# Patient Record
Sex: Female | Born: 2000 | Race: Black or African American | Hispanic: No | Marital: Single | Smoking: Never smoker
Health system: Southern US, Community
[De-identification: ages and names within clinical notes are randomized; demographics above are authoritative.]

---
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
## 2020-03-01 DIAGNOSIS — Z419 Encounter for procedure for purposes other than remedying health state, unspecified: Secondary | ICD-10-CM | POA: Diagnosis not present

## 2020-04-01 DIAGNOSIS — Z419 Encounter for procedure for purposes other than remedying health state, unspecified: Secondary | ICD-10-CM | POA: Diagnosis not present

## 2020-04-30 ENCOUNTER — Ambulatory Visit
Admission: EM | Admit: 2020-04-30 | Discharge: 2020-04-30 | Disposition: A | Discharge status: Home / Self Care | Payer: Medicaid Other | Attending: Emergency Medicine | Admitting: Emergency Medicine

## 2020-04-30 ENCOUNTER — Other Ambulatory Visit: Payer: Self-pay

## 2020-04-30 DIAGNOSIS — J069 Acute upper respiratory infection, unspecified: Secondary | ICD-10-CM | POA: Diagnosis not present

## 2020-04-30 DIAGNOSIS — Z20822 Contact with and (suspected) exposure to covid-19: Secondary | ICD-10-CM | POA: Insufficient documentation

## 2020-04-30 LAB — SARS CORONAVIRUS 2 (TAT 6-24 HRS): SARS Coronavirus 2: POSITIVE — AB

## 2020-04-30 NOTE — ED Provider Notes (Signed)
[facility]    CSN: [id number] Arrival date & time: 04/30/20  0941      History   Chief Complaint Chief Complaint  Patient presents with  . Nasal Congestion    HPI Victoria Howell is a 19 y.o. female who presents with URI symptoms and feeling hot and cold x 2 weeks. Denies body aches. Only had a HA x 1 days. Has had loss of smell and taste the fist week of sickness, which now taste is back, but sence of smell is still gone. Has been a little nauseous. Is no cycle right now.    History reviewed. No pertinent past medical history.  Patient Active Problem List   Diagnosis Date Noted  . MDD (major depressive disorder), single episode, severe (HCC) 04/13/2019    History reviewed. No pertinent surgical history.  OB History   No obstetric history on file.      Home Medications    Prior to Admission medications   Not on File    Family History History reviewed. No pertinent family history.  Social History Social History   Tobacco Use  . Smoking status: Never Smoker  . Smokeless tobacco: Never Used  Vaping Use  . Vaping Use: Every day  Substance Use Topics  . Alcohol use: No  . Drug use: Not Currently    Types: Marijuana     Allergies   Peanut oil   Review of Systems Review of Systems  Constitutional: Positive for appetite change, chills, diaphoresis and fatigue. Negative for activity change and fever.  HENT: Positive for congestion and postnasal drip. Negative for ear discharge, ear pain, rhinorrhea, sore throat and trouble swallowing.   Respiratory: Positive for cough. Negative for wheezing.        SOB with climbing steps  Cardiovascular: Negative for chest pain.  Gastrointestinal: Positive for nausea. Negative for abdominal pain, diarrhea and vomiting.       Has mild cramps from being on her period today   Genitourinary: Negative for difficulty urinating and dysuria.  Musculoskeletal: Negative for gait problem and myalgias.  Skin: Negative  for rash.  Neurological: Negative for dizziness, weakness and headaches.  Hematological: Negative for adenopathy.   Physical Exam Triage Vital Signs ED Triage Vitals  Enc Vitals Group     BP 04/30/20 1007 122/74     Pulse Rate 04/30/20 1007 79     Resp 04/30/20 1007 16     Temp 04/30/20 1007 98.3 F (36.8 C)     Temp Source 04/30/20 1007 Oral     SpO2 04/30/20 1007 100 %     Weight 04/30/20 1005 175 lb (79.4 kg)     Height 04/30/20 1005 5\' 3"  (1.6 m)     Head Circumference --      Peak Flow --      Pain Score 04/30/20 1005 0     Pain Loc --      Pain Edu? --      Excl. in GC? --    No data found.  Updated Vital Signs BP 122/74 (BP Location: Right Arm)   Pulse 79   Temp 98.3 F (36.8 C) (Oral)   Resp 16   Ht 5\' 3"  (1.6 m)   Wt 175 lb (79.4 kg)   LMP 04/27/2020   SpO2 100%   BMI 31.00 kg/m   Visual Acuity Right Eye Distance:   Left Eye Distance:   Bilateral Distance:    Right Eye Near:   Left Eye Near:  Bilateral Near:       Physical Exam Vitals signs and nursing note reviewed.  Constitutional:      General: She is not in acute distress.    Appearance: Normal appearance. She is not ill-appearing, toxic-appearing or diaphoretic.  HENT:     Head: Normocephalic.     Right Ear: Tympanic membrane, ear canal and external ear normal.     Left Ear: Tympanic membrane, ear canal and external ear normal.     Nose: has mild mucosa swelling on the R    Mouth/Throat:     Mouth: Mucous membranes are moist.  Eyes:     General: No scleral icterus.       Right eye: No discharge.        Left eye: No discharge.     Conjunctiva/sclera: Conjunctivae normal.  Neck:     Musculoskeletal: Neck supple. No neck rigidity.  Cardiovascular:     Rate and Rhythm: Normal rate and regular rhythm.     Heart sounds: No murmur.  Pulmonary:     Effort: Pulmonary effort is normal.     Breath sounds: Normal breath sounds.  Abdominal:     General: Bowel sounds are normal. There is  no distension.     Palpations: Abdomen is soft. There is no mass.     Tenderness: There is no abdominal tenderness. There is no guarding or rebound.     Hernia: No hernia is present.  Musculoskeletal: Normal range of motion.  Lymphadenopathy:     Cervical: No cervical adenopathy.  Skin:    General: Skin is warm and dry.     Coloration: Skin is not jaundiced.     Findings: No rash.  Neurological:     Mental Status: She is alert and oriented to person, place, and time.     Gait: Gait normal.  Psychiatric:        Mood and Affect: Mood normal.        Behavior: Behavior normal.        Thought Content: Thought content normal.        Judgment: Judgment normal.   UC Treatments / Results  Labs (all labs ordered are listed, but only abnormal results are displayed) Labs Reviewed  SARS CORONAVIRUS 2 (TAT 6-24 HRS)    EKG   Radiology No results found.  Procedures Procedures (including critical care time)  Medications Ordered in UC Medications - No data to display  Initial Impression / Assessment and Plan / UC Course  I have reviewed the triage vital signs and the nursing notes. Seems like she had Covid and is recovering from this.  She will check her Mychart for results.   Final Clinical Impressions(s) / UC Diagnoses   Final diagnoses:  Viral URI with cough     Discharge Instructions     Stay quarantined until your test is back     ED Prescriptions    None     PDMP not reviewed this encounter.   Garey Ham, PA-C 04/30/20 1121

## 2020-04-30 NOTE — ED Triage Notes (Addendum)
Pt reports she is here to determine if she has allergies or COVID. Runny nose, mild cough, scratchy throat x 2 weeks. Has to go to school and needs a COVID test.

## 2020-04-30 NOTE — Discharge Instructions (Signed)
Stay quarantined until your test is back.  

## 2020-05-02 DIAGNOSIS — Z419 Encounter for procedure for purposes other than remedying health state, unspecified: Secondary | ICD-10-CM | POA: Diagnosis not present

## 2020-06-01 DIAGNOSIS — Z419 Encounter for procedure for purposes other than remedying health state, unspecified: Secondary | ICD-10-CM | POA: Diagnosis not present

## 2020-06-13 ENCOUNTER — Other Ambulatory Visit: Payer: Self-pay

## 2020-06-13 ENCOUNTER — Encounter: Payer: Self-pay | Admitting: Emergency Medicine

## 2020-06-13 ENCOUNTER — Emergency Department
Admission: EM | Admit: 2020-06-13 | Discharge: 2020-06-13 | Disposition: A | Discharge status: Home / Self Care | Payer: Medicaid Other | Attending: Emergency Medicine | Admitting: Emergency Medicine

## 2020-06-13 ENCOUNTER — Emergency Department: Payer: Medicaid Other

## 2020-06-13 DIAGNOSIS — R519 Headache, unspecified: Secondary | ICD-10-CM | POA: Diagnosis not present

## 2020-06-13 DIAGNOSIS — S060X0A Concussion without loss of consciousness, initial encounter: Secondary | ICD-10-CM | POA: Diagnosis not present

## 2020-06-13 DIAGNOSIS — S0990XA Unspecified injury of head, initial encounter: Secondary | ICD-10-CM | POA: Diagnosis present

## 2020-06-13 MED ORDER — ACETAMINOPHEN 325 MG PO TABS
650.0000 mg | ORAL_TABLET | Freq: Once | ORAL | Status: AC
  Administered 2020-06-13: 650 mg via ORAL
  Filled 2020-06-13: qty 2
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
## 2020-06-13 NOTE — Discharge Instructions (Signed)
Please treat with tylenol 650 mg every 4-6 hours as needed for symptoms. Follow up with primary care if you do not improve, or return to the emergency department for any worsening.
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
## 2020-07-02 DIAGNOSIS — Z419 Encounter for procedure for purposes other than remedying health state, unspecified: Secondary | ICD-10-CM | POA: Diagnosis not present

## 2020-08-01 DIAGNOSIS — Z419 Encounter for procedure for purposes other than remedying health state, unspecified: Secondary | ICD-10-CM | POA: Diagnosis not present

## 2020-09-01 DIAGNOSIS — Z419 Encounter for procedure for purposes other than remedying health state, unspecified: Secondary | ICD-10-CM | POA: Diagnosis not present

## 2020-10-02 DIAGNOSIS — Z419 Encounter for procedure for purposes other than remedying health state, unspecified: Secondary | ICD-10-CM | POA: Diagnosis not present

## 2020-10-30 DIAGNOSIS — Z419 Encounter for procedure for purposes other than remedying health state, unspecified: Secondary | ICD-10-CM | POA: Diagnosis not present

## 2020-11-30 DIAGNOSIS — Z419 Encounter for procedure for purposes other than remedying health state, unspecified: Secondary | ICD-10-CM | POA: Diagnosis not present
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
## 2020-12-30 DIAGNOSIS — Z419 Encounter for procedure for purposes other than remedying health state, unspecified: Secondary | ICD-10-CM | POA: Diagnosis not present

## 2021-01-30 DIAGNOSIS — Z419 Encounter for procedure for purposes other than remedying health state, unspecified: Secondary | ICD-10-CM | POA: Diagnosis not present

## 2021-03-01 DIAGNOSIS — Z419 Encounter for procedure for purposes other than remedying health state, unspecified: Secondary | ICD-10-CM | POA: Diagnosis not present

## 2021-04-01 DIAGNOSIS — Z419 Encounter for procedure for purposes other than remedying health state, unspecified: Secondary | ICD-10-CM | POA: Diagnosis not present

## 2021-05-02 DIAGNOSIS — Z419 Encounter for procedure for purposes other than remedying health state, unspecified: Secondary | ICD-10-CM | POA: Diagnosis not present

## 2021-06-01 DIAGNOSIS — Z419 Encounter for procedure for purposes other than remedying health state, unspecified: Secondary | ICD-10-CM | POA: Diagnosis not present

## 2021-07-02 DIAGNOSIS — Z419 Encounter for procedure for purposes other than remedying health state, unspecified: Secondary | ICD-10-CM | POA: Diagnosis not present

## 2021-08-01 DIAGNOSIS — Z419 Encounter for procedure for purposes other than remedying health state, unspecified: Secondary | ICD-10-CM | POA: Diagnosis not present

## 2021-09-01 DIAGNOSIS — Z419 Encounter for procedure for purposes other than remedying health state, unspecified: Secondary | ICD-10-CM | POA: Diagnosis not present

## 2021-10-02 DIAGNOSIS — Z419 Encounter for procedure for purposes other than remedying health state, unspecified: Secondary | ICD-10-CM | POA: Diagnosis not present

## 2021-10-30 DIAGNOSIS — Z419 Encounter for procedure for purposes other than remedying health state, unspecified: Secondary | ICD-10-CM | POA: Diagnosis not present

## 2021-11-30 DIAGNOSIS — Z419 Encounter for procedure for purposes other than remedying health state, unspecified: Secondary | ICD-10-CM | POA: Diagnosis not present

## 2021-12-30 DIAGNOSIS — Z419 Encounter for procedure for purposes other than remedying health state, unspecified: Secondary | ICD-10-CM | POA: Diagnosis not present

## 2022-01-30 DIAGNOSIS — Z419 Encounter for procedure for purposes other than remedying health state, unspecified: Secondary | ICD-10-CM | POA: Diagnosis not present

## 2022-03-01 DIAGNOSIS — Z419 Encounter for procedure for purposes other than remedying health state, unspecified: Secondary | ICD-10-CM | POA: Diagnosis not present

## 2022-04-01 DIAGNOSIS — Z419 Encounter for procedure for purposes other than remedying health state, unspecified: Secondary | ICD-10-CM | POA: Diagnosis not present

## 2022-05-02 DIAGNOSIS — Z419 Encounter for procedure for purposes other than remedying health state, unspecified: Secondary | ICD-10-CM | POA: Diagnosis not present

## 2022-06-01 DIAGNOSIS — Z419 Encounter for procedure for purposes other than remedying health state, unspecified: Secondary | ICD-10-CM | POA: Diagnosis not present

## 2022-07-02 DIAGNOSIS — Z419 Encounter for procedure for purposes other than remedying health state, unspecified: Secondary | ICD-10-CM | POA: Diagnosis not present

## 2022-07-09 ENCOUNTER — Ambulatory Visit: Payer: Medicaid Other | Admitting: Physician Assistant

## 2022-08-01 DIAGNOSIS — Z419 Encounter for procedure for purposes other than remedying health state, unspecified: Secondary | ICD-10-CM | POA: Diagnosis not present

## 2022-09-01 DIAGNOSIS — Z419 Encounter for procedure for purposes other than remedying health state, unspecified: Secondary | ICD-10-CM | POA: Diagnosis not present

## 2022-09-27 ENCOUNTER — Emergency Department
Admission: EM | Admit: 2022-09-27 | Discharge: 2022-09-27 | Disposition: A | Discharge status: Home / Self Care | Payer: Medicaid Other | Attending: Emergency Medicine | Admitting: Emergency Medicine

## 2022-09-27 ENCOUNTER — Other Ambulatory Visit: Payer: Self-pay

## 2022-09-27 ENCOUNTER — Emergency Department: Payer: Medicaid Other

## 2022-09-27 DIAGNOSIS — S0990XA Unspecified injury of head, initial encounter: Secondary | ICD-10-CM | POA: Diagnosis present

## 2022-09-27 DIAGNOSIS — S70211A Abrasion, right hip, initial encounter: Secondary | ICD-10-CM | POA: Diagnosis not present

## 2022-09-27 DIAGNOSIS — Y92410 Unspecified street and highway as the place of occurrence of the external cause: Secondary | ICD-10-CM | POA: Insufficient documentation

## 2022-09-27 DIAGNOSIS — T07XXXA Unspecified multiple injuries, initial encounter: Secondary | ICD-10-CM

## 2022-09-27 DIAGNOSIS — R079 Chest pain, unspecified: Secondary | ICD-10-CM | POA: Diagnosis not present

## 2022-09-27 DIAGNOSIS — S0001XA Abrasion of scalp, initial encounter: Secondary | ICD-10-CM | POA: Diagnosis not present

## 2022-09-27 DIAGNOSIS — S0003XA Contusion of scalp, initial encounter: Secondary | ICD-10-CM | POA: Diagnosis not present

## 2022-09-27 DIAGNOSIS — Z043 Encounter for examination and observation following other accident: Secondary | ICD-10-CM | POA: Diagnosis not present

## 2022-09-27 DIAGNOSIS — S199XXA Unspecified injury of neck, initial encounter: Secondary | ICD-10-CM | POA: Diagnosis not present

## 2022-09-27 DIAGNOSIS — S70212A Abrasion, left hip, initial encounter: Secondary | ICD-10-CM | POA: Diagnosis not present

## 2022-09-27 DIAGNOSIS — S80212A Abrasion, left knee, initial encounter: Secondary | ICD-10-CM | POA: Insufficient documentation

## 2022-09-27 LAB — URINE DRUG SCREEN, QUALITATIVE (ARMC ONLY)
Amphetamines, Ur Screen: NOT DETECTED
Barbiturates, Ur Screen: NOT DETECTED
Benzodiazepine, Ur Scrn: NOT DETECTED
Cannabinoid 50 Ng, Ur ~~LOC~~: POSITIVE — AB
Cocaine Metabolite,Ur ~~LOC~~: NOT DETECTED
MDMA (Ecstasy)Ur Screen: NOT DETECTED
Methadone Scn, Ur: NOT DETECTED
Opiate, Ur Screen: NOT DETECTED
Phencyclidine (PCP) Ur S: NOT DETECTED
Tricyclic, Ur Screen: NOT DETECTED

## 2022-09-27 LAB — POC URINE PREG, ED: Preg Test, Ur: NEGATIVE

## 2022-09-27 MED ORDER — KETOROLAC TROMETHAMINE 10 MG PO TABS
10.0000 mg | ORAL_TABLET | Freq: Once | ORAL | Status: AC
  Administered 2022-09-27: 10 mg via ORAL
  Filled 2022-09-27: qty 1
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
## 2022-09-27 NOTE — ED Notes (Addendum)
Pt is adamantly refusing IV and blood at this time. EDP Roderic Palau at bedside.
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
## 2022-09-27 NOTE — ED Notes (Signed)
Patient refused IV or straight stick for blood draw. Dr. Joni Fears notified
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
## 2022-09-27 NOTE — Discharge Instructions (Addendum)
Your xrays and CT scans were all okay today.  Take ibuprofen or naproxen as needed for muscle soreness over the next few days. Return to the ER for further assessment if you have any new or worsening symptoms.

## 2022-09-27 NOTE — ED Notes (Signed)
Patient smells of alcohol. Patient reports she drinks daily

## 2022-09-27 NOTE — ED Notes (Signed)
Pt also smells of ETOH and admits to have been drinking.

## 2022-09-27 NOTE — ED Notes (Signed)
Patient reports pain generalized throughout
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
## 2022-10-02 DIAGNOSIS — Z419 Encounter for procedure for purposes other than remedying health state, unspecified: Secondary | ICD-10-CM | POA: Diagnosis not present

## 2022-10-31 DIAGNOSIS — Z419 Encounter for procedure for purposes other than remedying health state, unspecified: Secondary | ICD-10-CM | POA: Diagnosis not present

## 2022-12-01 DIAGNOSIS — Z419 Encounter for procedure for purposes other than remedying health state, unspecified: Secondary | ICD-10-CM | POA: Diagnosis not present

## 2022-12-31 DIAGNOSIS — Z419 Encounter for procedure for purposes other than remedying health state, unspecified: Secondary | ICD-10-CM | POA: Diagnosis not present

## 2023-01-31 DIAGNOSIS — Z419 Encounter for procedure for purposes other than remedying health state, unspecified: Secondary | ICD-10-CM | POA: Diagnosis not present

## 2023-03-02 DIAGNOSIS — Z419 Encounter for procedure for purposes other than remedying health state, unspecified: Secondary | ICD-10-CM | POA: Diagnosis not present

## 2023-04-02 DIAGNOSIS — Z419 Encounter for procedure for purposes other than remedying health state, unspecified: Secondary | ICD-10-CM | POA: Diagnosis not present

## 2023-05-03 DIAGNOSIS — Z419 Encounter for procedure for purposes other than remedying health state, unspecified: Secondary | ICD-10-CM | POA: Diagnosis not present

## 2023-06-02 DIAGNOSIS — Z419 Encounter for procedure for purposes other than remedying health state, unspecified: Secondary | ICD-10-CM | POA: Diagnosis not present

## 2023-07-03 DIAGNOSIS — Z419 Encounter for procedure for purposes other than remedying health state, unspecified: Secondary | ICD-10-CM | POA: Diagnosis not present

## 2023-08-02 DIAGNOSIS — Z419 Encounter for procedure for purposes other than remedying health state, unspecified: Secondary | ICD-10-CM | POA: Diagnosis not present

## 2023-09-02 DIAGNOSIS — Z419 Encounter for procedure for purposes other than remedying health state, unspecified: Secondary | ICD-10-CM | POA: Diagnosis not present

## 2023-10-03 DIAGNOSIS — Z419 Encounter for procedure for purposes other than remedying health state, unspecified: Secondary | ICD-10-CM | POA: Diagnosis not present

## 2023-10-31 DIAGNOSIS — Z419 Encounter for procedure for purposes other than remedying health state, unspecified: Secondary | ICD-10-CM | POA: Diagnosis not present

## 2023-12-12 DIAGNOSIS — Z419 Encounter for procedure for purposes other than remedying health state, unspecified: Secondary | ICD-10-CM | POA: Diagnosis not present

## 2024-01-11 DIAGNOSIS — Z419 Encounter for procedure for purposes other than remedying health state, unspecified: Secondary | ICD-10-CM | POA: Diagnosis not present

## 2024-02-11 DIAGNOSIS — Z419 Encounter for procedure for purposes other than remedying health state, unspecified: Secondary | ICD-10-CM | POA: Diagnosis not present

## 2024-03-12 DIAGNOSIS — Z419 Encounter for procedure for purposes other than remedying health state, unspecified: Secondary | ICD-10-CM | POA: Diagnosis not present

## 2024-04-12 DIAGNOSIS — Z419 Encounter for procedure for purposes other than remedying health state, unspecified: Secondary | ICD-10-CM | POA: Diagnosis not present

## 2024-05-10 DIAGNOSIS — Z8619 Personal history of other infectious and parasitic diseases: Secondary | ICD-10-CM | POA: Diagnosis not present

## 2024-05-13 DIAGNOSIS — Z419 Encounter for procedure for purposes other than remedying health state, unspecified: Secondary | ICD-10-CM | POA: Diagnosis not present

## 2024-07-13 DIAGNOSIS — Z419 Encounter for procedure for purposes other than remedying health state, unspecified: Secondary | ICD-10-CM | POA: Diagnosis not present

## 2024-08-12 DIAGNOSIS — Z419 Encounter for procedure for purposes other than remedying health state, unspecified: Secondary | ICD-10-CM | POA: Diagnosis not present
